# Patient Record
Sex: Male | Born: 1980 | Race: Black or African American | Hispanic: No | Marital: Single | Smoking: Current every day smoker
Health system: Southern US, Community
[De-identification: ages and names within clinical notes are randomized; demographics above are authoritative.]

## PROBLEM LIST (undated history)

## (undated) DIAGNOSIS — F1410 Cocaine abuse, uncomplicated: Secondary | ICD-10-CM

---

## 2015-06-19 ENCOUNTER — Encounter (HOSPITAL_BASED_OUTPATIENT_CLINIC_OR_DEPARTMENT_OTHER): Payer: Self-pay | Admitting: Emergency Medicine

## 2015-06-19 ENCOUNTER — Emergency Department (HOSPITAL_BASED_OUTPATIENT_CLINIC_OR_DEPARTMENT_OTHER)
Admission: EM | Admit: 2015-06-19 | Discharge: 2015-06-19 | Disposition: A | Discharge status: Home / Self Care | Payer: Self-pay | Attending: Emergency Medicine | Admitting: Emergency Medicine

## 2015-06-19 DIAGNOSIS — F1410 Cocaine abuse, uncomplicated: Secondary | ICD-10-CM | POA: Insufficient documentation

## 2015-06-19 DIAGNOSIS — F17200 Nicotine dependence, unspecified, uncomplicated: Secondary | ICD-10-CM | POA: Insufficient documentation

## 2015-06-19 HISTORY — DX: Cocaine abuse, uncomplicated: F14.10

## 2015-06-19 LAB — RAPID URINE DRUG SCREEN, HOSP PERFORMED
AMPHETAMINES: NOT DETECTED
BARBITURATES: NOT DETECTED
BENZODIAZEPINES: NOT DETECTED
COCAINE: POSITIVE — AB
OPIATES: NOT DETECTED
TETRAHYDROCANNABINOL: POSITIVE — AB

## 2015-06-19 NOTE — ED Notes (Signed)
Pt states has been doing a lot of cocaine, states having HA and nose pain. Nose very sensitive to touch, has been having bleeding from both nares. Also here for medical clearance for rehab admission. States has been snorting cocaine for 15 years. States does cocaine every day.

## 2015-06-19 NOTE — ED Provider Notes (Signed)
CSN: [id number]     Arrival date & time 06/19/15  1047 History   First MD Initiated Contact with Patient 06/19/15 1142     Chief Complaint  Patient presents with  . Medical Clearance     (Consider location/radiation/quality/duration/timing/severity/associated sxs/prior Treatment) HPI Comments: Patient is a 35 year old male with history of cocaine abuse. He presents for evaluation of sore nose and requesting rehabilitation. He reports an increased frequency of his cocaine use over the past 2-3 months and has come to the conclusion that this is causing problems for him. He is here requesting information about possible treatment centers.  The history is provided by the patient.    Past Medical History  Diagnosis Date  . Cocaine abuse    History reviewed. No pertinent past surgical history. History reviewed. No pertinent family history. Social History  Substance Use Topics  . Smoking status: Current Every Day Smoker  . Smokeless tobacco: None  . Alcohol Use: No    Review of Systems  All other systems reviewed and are negative.     Allergies  Review of patient's allergies indicates no known allergies.  Home Medications   Prior to Admission medications   Not on File   BP 146/98 mmHg  Pulse 86  Temp(Src) 98.2 F (36.8 C) (Oral)  Resp 16  Ht  (1.88 m)  Wt 310 lb (140.615 kg)  BMI 39.78 kg/m2  SpO2 97% Physical Exam  Constitutional: He is oriented to person, place, and time. He appears well-developed and well-nourished. No distress.  HENT:  Head: Normocephalic and atraumatic.  Neck: Normal range of motion. Neck supple.  Musculoskeletal: Normal range of motion.  Neurological: He is alert and oriented to person, place, and time.  Skin: Skin is warm and dry. He is not diaphoretic.  Nursing note and vitals reviewed.   ED Course  Procedures (including critical care time) Labs Review Labs Reviewed  URINE RAPID DRUG SCREEN, HOSP PERFORMED    Imaging  Review No results found. I have personally reviewed and evaluated these images and lab results as part of my medical decision-making.    MDM   Final diagnoses:  None    Patient will be given the substance abuse treatment resource guide. He otherwise appears medically stable.    Geoffery Lyons, MD 06/19/15 1155

## 2015-06-19 NOTE — ED Notes (Signed)
Pt requesting help with addiction to cocaine.  Pt wanting to go to rehab.  Pt also having nose pain.  Last use was two days ago.

## 2015-06-19 NOTE — Discharge Instructions (Signed)
Stimulant Use Disorder-Cocaine °Cocaine is one of a group of powerful drugs called stimulants. Cocaine has medical uses for stopping nosebleeds and for pain control before minor nose or dental surgery. However, cocaine is misused because of the effects that it produces. These effects include:  °· A feeling of extreme pleasure. °· Alertness. °· High energy. °Common street names for cocaine include coke, crack, blow, snow, and nose candy. Cocaine is snorted, dissolved in water and injected, or smoked.  °Stimulants are addictive because they activate regions of the brain that produce both the pleasurable sensation of "reward" and psychological dependence. Together, these actions account for loss of control and the rapid development of drug dependence. This means you become ill without the drug (withdrawal) and need to keep using it to function.  °Stimulant use disorder is use of stimulants that disrupts your daily life. It disrupts relationships with family and friends and how you do your job. Cocaine increases your blood pressure and heart rate. It can cause a heart attack or stroke. Cocaine can also cause death from irregular heart rate or seizures. °SYMPTOMS °Symptoms of stimulant use disorder with cocaine include: °· Use of cocaine in larger amounts or over a longer period of time than intended. °· Unsuccessful attempts to cut down or control cocaine use. °· A lot of time spent obtaining, using, or recovering from the effects of cocaine. °· A strong desire or urge to use cocaine (craving). °· Continued use of cocaine in spite of major problems at work, school, or home because of use. °· Continued use of cocaine in spite of relationship problems because of use. °· Giving up or cutting down on important life activities because of cocaine use. °· Use of cocaine over and over in situations when it is physically hazardous, such as driving a car. °· Continued use of cocaine in spite of a physical problem that is likely  related to use. Physical problems can include: °¨ Malnutrition. °¨ Nosebleeds. °¨ Chest pain. °¨ High blood pressure. °¨ A hole that develops between the part of your nose that separates your nostrils (perforated nasal septum). °¨ Lung and kidney damage. °· Continued use of cocaine in spite of a mental problem that is likely related to use. Mental problems can include: °¨ Schizophrenia-like symptoms. °¨ Depression. °¨ Bipolar mood swings. °¨ Anxiety. °¨ Sleep problems. °· Need to use more and more cocaine to get the same effect, or lessened effect over time with use of the same amount of cocaine (tolerance). °· Having withdrawal symptoms when cocaine use is stopped, or using cocaine to reduce or avoid withdrawal symptoms. Withdrawal symptoms include: °¨ Depressed or irritable mood. °¨ Low energy or restlessness. °¨ Bad dreams. °¨ Poor or excessive sleep. °¨ Increased appetite. °DIAGNOSIS °Stimulant use disorder is diagnosed by your health care provider. You may be asked questions about your cocaine use and how it affects your life. A physical exam may be done. A drug screen may be ordered. You may be referred to a mental health professional. The diagnosis of stimulant use disorder requires at least two symptoms within 12 months. The type of stimulant use disorder depends on the number of signs and symptoms you have. The type may be: °· Mild. Two or three signs and symptoms. °· Moderate. Four or five signs and symptoms. °· Severe. Six or more signs and symptoms. °TREATMENT °Treatment for stimulant use disorder is usually provided by mental health professionals with training in substance use disorders. The following options are available: °·   Counseling or talk therapy. Talk therapy addresses the reasons you use cocaine and ways to keep you from using again. Goals of talk therapy include:  Identifying and avoiding triggers for use.  Handling cravings.  Replacing use with healthy activities.  Support groups.  Support groups provide emotional support, advice, and guidance.  Medicine. Certain medicines may decrease cocaine cravings or withdrawal symptoms. HOME CARE INSTRUCTIONS  Take medicines only as directed by your health care provider.  Identify the people and activities that trigger your cocaine use and avoid them.  Keep all follow-up visits as directed by your health care provider. SEEK MEDICAL CARE IF:  Your symptoms get worse or you relapse.  You are not able to take medicines as directed. SEEK IMMEDIATE MEDICAL CARE IF:  You have serious thoughts about hurting yourself or others.  You have a seizure, chest pain, sudden weakness, or loss of speech or vision. FOR MORE INFORMATION  National Institute on Drug Abuse: http://www.price-smith.com/  Substance Abuse and Mental Health Services Administration: SkateOasis.com.pt   This information is not intended to replace advice given to you by your health care provider. Make sure you discuss any questions you have with your health care provider.   Document Released: 04/16/2000 Document Revised: 05/10/2014 Document Reviewed: 05/02/2013 Elsevier Interactive Patient Education 2016 [employer].    Emergency Department Resource Guide 1) Find a Doctor and Pay Out of Pocket Although you won't have to find out who is covered by your insurance plan, it is a good idea to ask around and get recommendations. You will then need to call the office and see if the doctor you have chosen will accept you as a new patient and what types of options they offer for patients who are self-pay. Some doctors offer discounts or will set up payment plans for their patients who do not have insurance, but you will need to ask so you aren't surprised when you get to your appointment.  2) Contact Your Local Health Department Not all health departments have doctors that can see patients for sick visits, but many do, so it is worth a call to see if yours does. If you don't know  where your local health department is, you can check in your phone book. The CDC also has a tool to help you locate your state's health department, and many state websites also have listings of all of their local health departments.  3) Find a Walk-in Clinic If your illness is not likely to be very severe or complicated, you may want to try a walk in clinic. These are popping up all over the country in pharmacies, drugstores, and shopping centers. They're usually staffed by nurse practitioners or physician assistants that have been trained to treat common illnesses and complaints. They're usually fairly quick and inexpensive. However, if you have serious medical issues or chronic medical problems, these are probably not your best option.  No Primary Care Doctor: - Call Health Connect at  [phone] - they can help you locate a primary care doctor that  accepts your insurance, provides certain services, etc. - Physician Referral Service- [phone]  Chronic Pain Problems: Organization         Address  Phone   Notes  [facility]  [phone] Patients need to be referred by their primary care doctor.   Medication Assistance: Organization         Address  Phone   Notes  [facility] Medication Assistance Program [address] [place], [place] [address] [phone] --Must be a resident of [facility] -- Must have NO insurance coverage whatsoever (no Medicaid/ Medicare, etc.) -- The pt. MUST have a primary care doctor that directs their care regularly and follows them in the community   [facility]  [phone]   [employer]  [phone]    Agencies that provide inexpensive medical care: Organization         Address  Phone   Notes  [facility]  [phone]   [facility]    [phone]   [facility] [address] [place], [place] [address] [phone]   [facility]  [address] [place] [phone]   Planned Parenthood    [phone]   [facility]    [phone]   [facility]  [address], [place] Phone:  [phone], Fax:  [phone] Hours of Operation:  9 am - 6 pm, M-F.  Also accepts Medicaid/Medicare and self-pay.  [facility]  [address], [place] Phone: [phone], Fax: [phone]. Hours of Operation:  8:30 am - 5:30 pm, M-F.  Also accepts Medicaid and self-pay.  [facility] [place] [address] [place] Phone: [phone]   [facility] [address] Natasha Bence [place], [place] [phone], Ext. [phone] Mondays & Thursdays: 7-9 AM.  First 15 patients are seen on a first come, first serve basis.    Medicaid-accepting [facility] Providers:  Organization         Address  Phone   Notes  [facility] [address], [place] [phone] Also accepts self-pay patients.  [facility] [address] Laurell Josephs [place], [place]  [phone]   [facility] [address], [place] [phone]   [facility] [address] [place] [phone]   Renaye Rakers [address], [place]   [phone] Only accepts [place] Access [place] patients after they have their name applied to their card.   Self-Pay (no insurance) in [facility]:  Organization         Address  Phone   Notes  Sickle Cell Patients, [facility] [address] [place], [place] [phone]   [facility] [address] [place], [place] [phone]   [facility]  [address], [place] [phone]   [facility]/Dr. Osei-Bonsu  [address] [place] or [address], [place] [phone] Phone number for both  [place] and [place] locations is the same.  [facility] [address] [place] [phone]   [facility] [address], [place] or [address] [phone]   [facility] [address] [place] [phone], phone; [phone], fax Sees patients 1st and 3rd Saturday of every month.  Must not qualify for public or private insurance (i.e. Medicaid, Medicare, [place] Health Choice, Veterans' Benefits)  Household income should be no more than 200% of the poverty level The clinic cannot treat you if you are pregnant or think you are pregnant  Sexually transmitted diseases are not treated at the clinic.  Dental Care: Organization         Address  Phone  Notes  [facility] Department of [facility] [address] [place], [place] [phone] Accepts children up to age 69 who are enrolled in [place] or [place] Health Choice; pregnant women with a Medicaid card; and children who have applied for Medicaid or [place] Health Choice, but were declined, whose parents can pay a reduced fee at time of service.  [facility] Department of [facility]  [address], [place] [phone] Accepts children up to age 7 who are enrolled in [place] or [place] Health Choice; pregnant women with a Medicaid card; and children who have applied for Medicaid or [place] Health Choice, but were declined, whose parents can pay a reduced fee at time of service.  [facility]  [address] [place], [place] [phone] Patients are seen by appointment only. Walk-ins are not accepted. [facility] will see patients 12 years of age and older. Monday - Tuesday (8am-5pm) Most Wednesdays (8:30-5pm) $30 per visit, cash only  [facility] Adult Dental Access PROGRAM  [address], [facility] [phone] Patients are seen by appointment only. Walk-ins are not  accepted. [facility] will see patients 62 years of age and older. One Wednesday Evening (Monthly: Volunteer Based).  $30 per visit, cash only  [employer]  [phone] for adults; Children under age 2, call Graduate Pediatric Dentistry at [phone]. Children aged 73-14, please call [phone] to request a pediatric application.  Dental services are provided in all areas of dental care including fillings, crowns and bridges, complete and partial dentures, implants, gum treatment, root canals, and extractions. Preventive care is also provided. Treatment is provided to both adults and children. Patients are selected via a lottery and there is often a waiting list.   [facility] [address] [place]  [phone] [email or web address]   [facility] [address] [place], [place] [phone], Ext. [phone] Second and Fourth Thursday of each month, opens at 6:30 AM; Clinic ends at 9 AM.  Patients are seen on a first-come first-served basis, and a limited number are seen during each clinic.   [facility]  [address] Ether Griffins [place], [place] [phone]   Eligibility Requirements You must have lived in [place], [place], or [place] counties for at least the last three months.   You cannot be eligible for state or federal sponsored [place], including [employer], [place], or [employer].   You generally cannot be eligible for healthcare insurance through your employer.    How to apply: Eligibility screenings are held every Tuesday and Wednesday afternoon from 1:00 pm until 4:00 pm. You do not need an appointment for the interview!  [facility] [address] [place], [place] [phone]   [facility] Health Department  [phone]   [facility] Health Department  [phone]   [facility] Health Department  [phone]    Behavioral Health Resources in the  Community: Intensive Outpatient Programs Organization         Address  Phone  Notes  [facility] Services [address], [place], [place] [phone]   [facility] Outpatient [address] [place], [place] [phone]   [facility] [address], [place], [place]  [phone]   [facility] Mental Health [address],  [place], [place] [phone] or [phone]   Substance Abuse Resources Organization         Address  Phone  Notes  [facility]  [phone]   [facility]  [phone]   [facility]  [phone]   Floydene Flock  [phone]   Residential & Outpatient Substance Abuse Program  [phone]   Psychological Services Organization         Address  Phone  Notes  [facility] Behavioral Health  [phone]   [facility] Services  [phone]   [facility] Mental [facility] [address] [place] [phone] or [phone]    Mobile Crisis Teams Organization         Address  Phone  Notes  [facility], Mobile Crisis Care Unit  [phone]   [facility]  [address] [place], [place] [phone]   Doristine Locks [address] [place] [phone]    Self-Help/Support Groups Organization         Address  Phone             Notes  [facility] [place] - variety of support groups  336- I7437963 Call for more information  Narcotics Anonymous (NA), [facility] [address], [employer] [place]  2 meetings at this location   Statistician         Address  Phone  Notes  [facility] [address],    [place]  [phone]   [facility]  [address] [place] [address], [place], [place] [phone]   [facility] Treatment Facility [address] [place], [place] [phone] Admissions: 8am-3pm M-F  [facility] [address]    [place], [place] [phone]   [facility] [address] [place], [place], [place] [phone]   [facility] [address],  [place], [place] [phone]   [facility] - Intensive Outpatient [address] Laurell Josephs [address], [place], [place] [phone]   [facility] ([facility] [address] [place]  [place], [place] [phone] or [phone]   [facility] ([facility]) [address] [place], [place] [phone] Accepts Medicaid  [facility] [address]  [place] [phone] Substance Abuse/Addiction Treatment   [facility] Organization         Address  Phone  Notes  [facility]  6025951940   Angie Fava, PhD [address] Ervin Knack [place], [place]   [phone] or ([phone]   [facility] Behavioral   [address] [place], [place] [phone]   [facility] [address] [place], [place] [phone] Insurance/Medicaid/sponsorship through [facility] and Families [address]                                    [place], [place] [phone] Therapy/tele-psych/case  [facility] [address], [place] [phone]    Dr. Lolly Mustache  [phone]   [facility]  [employer] [facility] Dept. 1) [address], [place] 2) [address], [place] 3)  [address], [place] [phone]  [phone]   [facility] Child Abuse Hotline 828-091-0037 or [phone] (After Hours)

## 2017-04-25 ENCOUNTER — Emergency Department (HOSPITAL_BASED_OUTPATIENT_CLINIC_OR_DEPARTMENT_OTHER): Payer: Self-pay

## 2017-04-25 ENCOUNTER — Encounter (HOSPITAL_BASED_OUTPATIENT_CLINIC_OR_DEPARTMENT_OTHER): Payer: Self-pay | Admitting: Adult Health

## 2017-04-25 ENCOUNTER — Other Ambulatory Visit: Payer: Self-pay

## 2017-04-25 ENCOUNTER — Emergency Department (HOSPITAL_BASED_OUTPATIENT_CLINIC_OR_DEPARTMENT_OTHER)
Admission: EM | Admit: 2017-04-25 | Discharge: 2017-04-25 | Disposition: A | Discharge status: Home / Self Care | Payer: Self-pay | Attending: Emergency Medicine | Admitting: Emergency Medicine

## 2017-04-25 DIAGNOSIS — F17210 Nicotine dependence, cigarettes, uncomplicated: Secondary | ICD-10-CM | POA: Insufficient documentation

## 2017-04-25 DIAGNOSIS — J189 Pneumonia, unspecified organism: Secondary | ICD-10-CM | POA: Insufficient documentation

## 2017-04-25 LAB — COMPREHENSIVE METABOLIC PANEL
ALBUMIN: 3.7 g/dL (ref 3.5–5.0)
ALT: 15 U/L — ABNORMAL LOW (ref 17–63)
AST: 30 U/L (ref 15–41)
Alkaline Phosphatase: 67 U/L (ref 38–126)
Anion gap: 13 (ref 5–15)
BILIRUBIN TOTAL: 0.7 mg/dL (ref 0.3–1.2)
BUN: 17 mg/dL (ref 6–20)
CHLORIDE: 95 mmol/L — AB (ref 101–111)
CO2: 28 mmol/L (ref 22–32)
Calcium: 8.7 mg/dL — ABNORMAL LOW (ref 8.9–10.3)
Creatinine, Ser: 1.57 mg/dL — ABNORMAL HIGH (ref 0.61–1.24)
GFR calc Af Amer: >60 mL/min (ref >60)
GFR calc non Af Amer: 55 mL/min — ABNORMAL LOW (ref >60)
GLUCOSE: 135 mg/dL — AB (ref 65–99)
POTASSIUM: 3.3 mmol/L — AB (ref 3.5–5.1)
Sodium: 136 mmol/L (ref 135–145)
TOTAL PROTEIN: 8.7 g/dL — AB (ref 6.5–8.1)

## 2017-04-25 LAB — CBC WITH DIFFERENTIAL/PLATELET
Basophils Absolute: 0 10*3/uL (ref 0.0–0.1)
Basophils Relative: 0 %
EOS PCT: 0 %
Eosinophils Absolute: 0 10*3/uL (ref 0.0–0.7)
HCT: 49.6 % (ref 39.0–52.0)
Hemoglobin: 16.5 g/dL (ref 13.0–17.0)
LYMPHS ABS: 1.4 10*3/uL (ref 0.7–4.0)
LYMPHS PCT: 7 %
MCH: 28.7 pg (ref 26.0–34.0)
MCHC: 33.3 g/dL (ref 30.0–36.0)
MCV: 86.3 fL (ref 78.0–100.0)
MONOS PCT: 6 %
Monocytes Absolute: 1.1 10*3/uL — ABNORMAL HIGH (ref 0.1–1.0)
Neutro Abs: 16.2 10*3/uL — ABNORMAL HIGH (ref 1.7–7.7)
Neutrophils Relative %: 87 %
PLATELETS: 161 10*3/uL (ref 150–400)
RBC: 5.75 MIL/uL (ref 4.22–5.81)
RDW: 14.3 % (ref 11.5–15.5)
WBC: 18.7 10*3/uL — ABNORMAL HIGH (ref 4.0–10.5)

## 2017-04-25 MED ORDER — KETOROLAC TROMETHAMINE 30 MG/ML IJ SOLN
30.0000 mg | Freq: Once | INTRAMUSCULAR | Status: AC
  Administered 2017-04-25: 30 mg via INTRAVENOUS

## 2017-04-25 MED ORDER — ALBUTEROL SULFATE HFA 108 (90 BASE) MCG/ACT IN AERS
2.0000 | INHALATION_SPRAY | Freq: Once | RESPIRATORY_TRACT | Status: AC
  Administered 2017-04-25: 2 via RESPIRATORY_TRACT
  Filled 2017-04-25: qty 6.7

## 2017-04-25 MED ORDER — SODIUM CHLORIDE 0.9 % IV BOLUS (SEPSIS)
1000.0000 mL | Freq: Once | INTRAVENOUS | Status: AC
  Administered 2017-04-25: 1000 mL via INTRAVENOUS

## 2017-04-25 MED ORDER — IBUPROFEN 800 MG PO TABS: 800.0000 mg | ORAL_TABLET | Freq: Three times a day (TID) | ORAL | 0 refills | Status: AC | PRN

## 2017-04-25 MED ORDER — DEXTROSE 5 % IV SOLN
1.0000 g | Freq: Once | INTRAVENOUS | Status: AC
  Administered 2017-04-25: 1 g via INTRAVENOUS
  Filled 2017-04-25: qty 10

## 2017-04-25 MED ORDER — METOCLOPRAMIDE HCL 10 MG PO TABS
10.0000 mg | ORAL_TABLET | Freq: Once | ORAL | Status: AC
  Administered 2017-04-25: 10 mg via ORAL
  Filled 2017-04-25: qty 1

## 2017-04-25 MED ORDER — KETOROLAC TROMETHAMINE 30 MG/ML IJ SOLN
30.0000 mg | Freq: Once | INTRAMUSCULAR | Status: DC
  Filled 2017-04-25: qty 1

## 2017-04-25 MED ORDER — DOXYCYCLINE HYCLATE 100 MG PO CAPS: 100.0000 mg | ORAL_CAPSULE | Freq: Two times a day (BID) | ORAL | 0 refills | Status: AC

## 2017-04-25 NOTE — ED Provider Notes (Signed)
Emergency Department Provider Note   I have reviewed the triage vital signs and the nursing notes.   HISTORY  Chief Complaint Fever   HPI Desmond LopeShawn Galen is a 36 y.o. male stents to the emergency department for evaluation of chills, subjective fevers, sweating, shortness of breath with nasal congestion and headache.  States his runny nose began 2 days ago.  He has been fatigued aching pain in his muscles including his back, arms, legs.  Had some diffuse abdominal discomfort but denies any nausea, vomiting, or diarrhea.  No sick contacts. No radiation of symptoms. No modifying factors.   Past Medical History:  Diagnosis Date  . Cocaine abuse (HCC)     There are no active problems to display for this patient.   History reviewed. No pertinent surgical history.    Allergies Patient has no known allergies.  History reviewed. No pertinent family history.  Social History Social History   Tobacco Use  . Smoking status: Current Every Day Smoker  Substance Use Topics  . Alcohol use: No  . Drug use: Yes    Types: Cocaine    Review of Systems  Constitutional: Positive subjective fever/chills Eyes: No visual changes. ENT: No sore throat. Positive runny nose.  Cardiovascular: Denies chest pain. Respiratory: Positive shortness of breath and cough.  Gastrointestinal: No abdominal pain. No nausea, no vomiting.  No diarrhea.  No constipation. Genitourinary: Negative for dysuria. Musculoskeletal: Negative for back pain. Positive muscle aches.  Skin: Negative for rash. Neurological: Negative for focal weakness or numbness. Positive HA.   10-point ROS otherwise negative.  ____________________________________________   PHYSICAL EXAM:  VITAL SIGNS: ED Triage Vitals  Enc Vitals Group     BP 04/25/17 1941 (!) 163/109     Pulse Rate 04/25/17 1941 (!) 110     Resp 04/25/17 1941 (!) 22     Temp 04/25/17 1941 98.8 F (37.1 C)     Temp Source 04/25/17 1941 Oral     SpO2  04/25/17 1941 99 %     Weight 04/25/17 1942 263 lb (119.3 kg)     Height 04/25/17 1942 6\' 3"  (1.905 m)     Pain Score 04/25/17 1941 9   Constitutional: Alert and oriented. Well appearing and in no acute distress. Eyes: Conjunctivae are normal. Head: Atraumatic. Nose: No congestion/rhinnorhea. Mouth/Throat: Mucous membranes are moist.  Neck: No stridor.  Cardiovascular: Tachycardia. Good peripheral circulation. Grossly normal heart sounds.   Respiratory: Normal respiratory effort.  No retractions. Lungs CTAB. Gastrointestinal: Soft and nontender. No distention.  Musculoskeletal: No lower extremity tenderness nor edema. No gross deformities of extremities. Neurologic:  Normal speech and language. No gross focal neurologic deficits are appreciated.  Skin:  Skin is warm, dry and intact. No rash noted.  ____________________________________________   LABS (all labs ordered are listed, but only abnormal results are displayed)  Labs Reviewed  COMPREHENSIVE METABOLIC PANEL - Abnormal; Notable for the following components:      Result Value   Potassium 3.3 (*)    Chloride 95 (*)    Glucose, Bld 135 (*)    Creatinine, Ser 1.57 (*)    Calcium 8.7 (*)    Total Protein 8.7 (*)    ALT 15 (*)    GFR calc non Af Amer 55 (*)    All other components within normal limits  CBC WITH DIFFERENTIAL/PLATELET - Abnormal; Notable for the following components:   WBC 18.7 (*)    Neutro Abs 16.2 (*)    Monocytes Absolute  1.1 (*)    All other components within normal limits   ____________________________________________  RADIOLOGY  Dg Chest 2 View  Result Date: 04/25/2017 CLINICAL DATA:  36 year old male with cough. EXAM: CHEST  2 VIEW COMPARISON:  None. FINDINGS: There is a focal area of consolidation in the right lower lobe most consistent with pneumonia. Clinical correlation and follow-up to resolution recommended. The left lung is clear. No pleural effusion or pneumothorax. The cardiac silhouette  is within normal limits. No acute osseous pathology. IMPRESSION: Right lower lobe pneumonia. Electronically Signed   By: Elgie CollardArash  Radparvar M.D.   On: 04/25/2017 20:07    ____________________________________________   PROCEDURES  Procedure(s) performed:   Procedures  None ____________________________________________   INITIAL IMPRESSION / ASSESSMENT AND PLAN / ED COURSE  Pertinent labs & imaging results that were available during my care of the patient were reviewed by me and considered in my medical decision making (see chart for details).  Patient presents to the emergency department for evaluation of flulike symptoms that began 2+ days ago.  She has a mild tachycardia on arrival.  Lungs are clear to auscultation bilaterally.  He does smoke cigarettes and I counseled him on smoking cessation.  Plan for albuterol inhaler Toradol, Reglan, chest x-ray to rule out pneumonia.  Discussed possibility of flu.  I do not have a rapid flu test available and he is close to if not out of the window to benefit from Tamiflu.  I discussed this with him in detail.   Patient with PNA on CXR. Rocephin given. No hypoxemia. Plan for abx and supportive care.   At this time, I do not feel there is any life-threatening condition present. I have reviewed and discussed all results (EKG, imaging, lab, urine as appropriate), exam findings with patient. I have reviewed nursing notes and appropriate previous records.  I feel the patient is safe to be discharged home without further emergent workup. Discussed usual and customary return precautions. Patient and family (if present) verbalize understanding and are comfortable with this plan.  Patient will follow-up with their primary care provider. If they do not have a primary care provider, information for follow-up has been provided to them. All questions have been answered.  ____________________________________________  FINAL CLINICAL IMPRESSION(S) / ED  DIAGNOSES  Final diagnoses:  Community acquired pneumonia of right lung, unspecified part of lung     MEDICATIONS GIVEN DURING THIS VISIT:  Medications  metoCLOPramide (REGLAN) tablet 10 mg (10 mg Oral Given 04/25/17 2022)  albuterol (PROVENTIL HFA;VENTOLIN HFA) 108 (90 Base) MCG/ACT inhaler 2 puff (2 puffs Inhalation Given 04/25/17 2002)  sodium chloride 0.9 % bolus 1,000 mL (0 mLs Intravenous Stopped 04/25/17 2140)  ketorolac (TORADOL) 30 MG/ML injection 30 mg (30 mg Intravenous Given 04/25/17 2037)  cefTRIAXone (ROCEPHIN) 1 g in dextrose 5 % 50 mL IVPB (0 g Intravenous Stopped 04/25/17 2140)     NEW OUTPATIENT MEDICATIONS STARTED DURING THIS VISIT:  Doxycycline and Motrin   Note:  This document was prepared using Dragon voice recognition software and may include unintentional dictation errors.  Alona BeneJoshua Long, MD Emergency Medicine    Long, Arlyss RepressJoshua G, MD 04/25/17 2308

## 2017-04-25 NOTE — ED Triage Notes (Addendum)
PResent with chills, subjective fevers, sweating, SOB and upper airway congestion as well as headache, runny nose that began 2 days ago. Endorses fatigue. Using Ibuprofen and tylemol at home. Denies vomiting, nausea and diarrhea

## 2017-04-25 NOTE — Discharge Instructions (Signed)
We believe that your symptoms are caused today by pneumonia, an infection in your lung(s).  Fortunately you should start to improve quickly after taking your antibiotics.  Please take the full course of antibiotics as prescribed and drink plenty of fluids.   ° °Follow up with your doctor within 1-2 days.  If you develop any new or worsening symptoms, including but not limited to fever in spite of taking over-the-counter ibuprofen and/or Tylenol, persistent vomiting, worsening shortness of breath, or other symptoms that concern you, please return to the Emergency Department immediately.  ° ° °Pneumonia °Pneumonia is an infection of the lungs.  °CAUSES °Pneumonia may be caused by bacteria or a virus. Usually, these infections are caused by breathing infectious particles into the lungs (respiratory tract). °SIGNS AND SYMPTOMS  °Cough. °Fever. °Chest pain. °Increased rate of breathing. °Wheezing. °Mucus production. °DIAGNOSIS  °If you have the common symptoms of pneumonia, your health care provider will typically confirm the diagnosis with a chest X-ray. The X-ray will show an abnormality in the lung (pulmonary infiltrate) if you have pneumonia. Other tests of your blood, urine, or sputum may be done to find the specific cause of your pneumonia. Your health care provider may also do tests (blood gases or pulse oximetry) to see how well your lungs are working. °TREATMENT  °Some forms of pneumonia may be spread to other people when you cough or sneeze. You may be asked to wear a mask before and during your exam. Pneumonia that is caused by bacteria is treated with antibiotic medicine. Pneumonia that is caused by the influenza virus may be treated with an antiviral medicine. Most other viral infections must run their course. These infections will not respond to antibiotics.  °HOME CARE INSTRUCTIONS  °Cough suppressants may be used if you are losing too much rest. However, coughing protects you by clearing your lungs. You  should avoid using cough suppressants if you can. °Your health care provider may have prescribed medicine if he or she thinks your pneumonia is caused by bacteria or influenza. Finish your medicine even if you start to feel better. °Your health care provider may also prescribe an expectorant. This loosens the mucus to be coughed up. °Take medicines only as directed by your health care provider. °Do not smoke. Smoking is a common cause of bronchitis and can contribute to pneumonia. If you are a smoker and continue to smoke, your cough may last several weeks after your pneumonia has cleared. °A cold steam vaporizer or humidifier in your room or home may help loosen mucus. °Coughing is often worse at night. Sleeping in a semi-upright position in a recliner or using a couple pillows under your head will help with this. °Get rest as you feel it is needed. Your body will usually let you know when you need to rest. °PREVENTION °A pneumococcal shot (vaccine) is available to prevent a common bacterial cause of pneumonia. This is usually suggested for: °People over 65 years old. °Patients on chemotherapy. °People with chronic lung problems, such as bronchitis or emphysema. °People with immune system problems. °If you are over 65 or have a high risk condition, you may receive the pneumococcal vaccine if you have not received it before. In some countries, a routine influenza vaccine is also recommended. This vaccine can help prevent some cases of pneumonia. You may be offered the influenza vaccine as part of your care. °If you smoke, it is time to quit. You may receive instructions on how to stop smoking. Your   health care provider can provide medicines and counseling to help you quit. °SEEK MEDICAL CARE IF: °You have a fever. °SEEK IMMEDIATE MEDICAL CARE IF:  °Your illness becomes worse. This is especially true if you are elderly or weakened from any other disease. °You cannot control your cough with suppressants and are losing  sleep. °You begin coughing up blood. °You develop pain which is getting worse or is uncontrolled with medicines. °Any of the symptoms which initially brought you in for treatment are getting worse rather than better. °You develop shortness of breath or chest pain. °MAKE SURE YOU:  °Understand these instructions. °Will watch your condition. °Will get help right away if you are not doing well or get worse. °Document Released: 04/19/2005 Document Revised: 09/03/2013 Document Reviewed: 07/09/2010 °[employer]® Patient Information ©2015 [employer]. This information is not intended to replace advice given to you by your health care provider. Make sure you discuss any questions you have with your health care provider. ° ° ° °

## 2020-10-20 ENCOUNTER — Other Ambulatory Visit: Payer: Self-pay

## 2020-10-20 ENCOUNTER — Encounter (HOSPITAL_BASED_OUTPATIENT_CLINIC_OR_DEPARTMENT_OTHER): Payer: Self-pay

## 2020-10-20 ENCOUNTER — Emergency Department (HOSPITAL_BASED_OUTPATIENT_CLINIC_OR_DEPARTMENT_OTHER)
Admission: EM | Admit: 2020-10-20 | Discharge: 2020-10-20 | Disposition: A | Discharge status: Home / Self Care | Payer: Self-pay | Attending: Emergency Medicine | Admitting: Emergency Medicine

## 2020-10-20 DIAGNOSIS — S199XXA Unspecified injury of neck, initial encounter: Secondary | ICD-10-CM | POA: Insufficient documentation

## 2020-10-20 DIAGNOSIS — M542 Cervicalgia: Secondary | ICD-10-CM

## 2020-10-20 DIAGNOSIS — Y92410 Unspecified street and highway as the place of occurrence of the external cause: Secondary | ICD-10-CM | POA: Insufficient documentation

## 2020-10-20 DIAGNOSIS — S0990XA Unspecified injury of head, initial encounter: Secondary | ICD-10-CM | POA: Insufficient documentation

## 2020-10-20 DIAGNOSIS — F17290 Nicotine dependence, other tobacco product, uncomplicated: Secondary | ICD-10-CM | POA: Insufficient documentation

## 2020-10-20 NOTE — Discharge Instructions (Addendum)
Ibuprofen for soreness  
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
## 2020-11-25 ENCOUNTER — Emergency Department (HOSPITAL_BASED_OUTPATIENT_CLINIC_OR_DEPARTMENT_OTHER)
Admission: EM | Admit: 2020-11-25 | Discharge: 2020-11-26 | Disposition: A | Discharge status: Home / Self Care | Payer: Self-pay | Attending: Emergency Medicine | Admitting: Emergency Medicine

## 2020-11-25 ENCOUNTER — Encounter (HOSPITAL_BASED_OUTPATIENT_CLINIC_OR_DEPARTMENT_OTHER): Payer: Self-pay

## 2020-11-25 ENCOUNTER — Emergency Department (HOSPITAL_BASED_OUTPATIENT_CLINIC_OR_DEPARTMENT_OTHER): Payer: Self-pay

## 2020-11-25 ENCOUNTER — Other Ambulatory Visit: Payer: Self-pay

## 2020-11-25 DIAGNOSIS — R0981 Nasal congestion: Secondary | ICD-10-CM | POA: Insufficient documentation

## 2020-11-25 DIAGNOSIS — F17290 Nicotine dependence, other tobacco product, uncomplicated: Secondary | ICD-10-CM | POA: Insufficient documentation

## 2020-11-25 DIAGNOSIS — R5383 Other fatigue: Secondary | ICD-10-CM | POA: Insufficient documentation

## 2020-11-25 DIAGNOSIS — R350 Frequency of micturition: Secondary | ICD-10-CM | POA: Insufficient documentation

## 2020-11-25 DIAGNOSIS — Z20822 Contact with and (suspected) exposure to covid-19: Secondary | ICD-10-CM | POA: Insufficient documentation

## 2020-11-25 DIAGNOSIS — R0789 Other chest pain: Secondary | ICD-10-CM

## 2020-11-25 DIAGNOSIS — R059 Cough, unspecified: Secondary | ICD-10-CM | POA: Insufficient documentation

## 2020-11-25 DIAGNOSIS — R072 Precordial pain: Secondary | ICD-10-CM | POA: Insufficient documentation

## 2020-11-25 DIAGNOSIS — R739 Hyperglycemia, unspecified: Secondary | ICD-10-CM

## 2020-11-25 LAB — BASIC METABOLIC PANEL
Anion gap: 13 (ref 5–15)
BUN: 23 mg/dL — ABNORMAL HIGH (ref 6–20)
CO2: 24 mmol/L (ref 22–32)
Calcium: 9.6 mg/dL (ref 8.9–10.3)
Chloride: 91 mmol/L — ABNORMAL LOW (ref 98–111)
Creatinine, Ser: 1.68 mg/dL — ABNORMAL HIGH (ref 0.61–1.24)
GFR, Estimated: 52 mL/min — ABNORMAL LOW (ref >60)
Glucose, Bld: 476 mg/dL — ABNORMAL HIGH (ref 70–99)
Potassium: 4.6 mmol/L (ref 3.5–5.1)
Sodium: 128 mmol/L — ABNORMAL LOW (ref 135–145)

## 2020-11-25 LAB — CBC
HCT: 48.5 % (ref 39.0–52.0)
Hemoglobin: 16.7 g/dL (ref 13.0–17.0)
MCH: 28.9 pg (ref 26.0–34.0)
MCHC: 34.4 g/dL (ref 30.0–36.0)
MCV: 83.9 fL (ref 80.0–100.0)
Platelets: 222 10*3/uL (ref 150–400)
RBC: 5.78 MIL/uL (ref 4.22–5.81)
RDW: 13.5 % (ref 11.5–15.5)
WBC: 10.6 10*3/uL — ABNORMAL HIGH (ref 4.0–10.5)
nRBC: 0 % (ref 0.0–0.2)

## 2020-11-25 LAB — TROPONIN I (HIGH SENSITIVITY): Troponin I (High Sensitivity): 4 ng/L (ref <18)

## 2020-11-25 MED ORDER — SODIUM CHLORIDE 0.9 % IV BOLUS
1000.0000 mL | Freq: Once | INTRAVENOUS | Status: AC
  Administered 2020-11-26: 1000 mL via INTRAVENOUS

## 2020-11-25 MED ORDER — INSULIN ASPART 100 UNIT/ML IJ SOLN
8.0000 [IU] | Freq: Once | INTRAMUSCULAR | Status: AC
  Administered 2020-11-26: 8 [IU] via INTRAVENOUS

## 2020-11-25 NOTE — ED Triage Notes (Signed)
Reports chest pain.  Reports he was told his heart beats slow. Now he feels his heart is beating too fast.  Patient was incarcerated and had similar symptoms.  Has been out for 34 days. Also complains of cough.

## 2020-11-25 NOTE — ED Provider Notes (Signed)
[facility] EMERGENCY DEPARTMENT Provider Note   CSN: [id number] Arrival date & time: 11/25/20  2133     History Chief Complaint  Patient presents with   Chest Pain    Richard Mata is a 40 y.o. male.  Patient is a 40 year old male with no significant past medical history.  Patient presenting today with complaints of chest congestion, cough, fatigue, frequent urination, and increased thirst.  This has been worsening over the past several days.  He denies any fevers or chills.  He denies any ill contacts.  He tells me he did take a COVID test at home which was negative.  The history is provided by the patient.  Chest Pain Pain location:  Substernal area Pain quality: pressure   Pain radiates to:  Does not radiate Pain severity:  Moderate Onset quality:  Gradual Duration:  3 days Timing:  Intermittent Progression:  Worsening Chronicity:  New     Past Medical History:  Diagnosis Date   Cocaine abuse (HCC)     There are no problems to display for this patient.   History reviewed. No pertinent surgical history.     No family history on file.  Social History   Tobacco Use   Smoking status: Every Day    Types: Cigars   Smokeless tobacco: Never  Vaping Use   Vaping Use: Never used  Substance Use Topics   Alcohol use: No   Drug use: Yes    Types: Cocaine    Home Medications Prior to Admission medications   Medication Sig Start Date End Date Taking? Authorizing Provider  doxycycline (VIBRAMYCIN) 100 MG capsule Take 1 capsule (100 mg total) by mouth 2 (two) times daily. 04/25/17   Long, Arlyss Repress, MD  ibuprofen (ADVIL,MOTRIN) 800 MG tablet Take 1 tablet (800 mg total) by mouth every 8 (eight) hours as needed. 04/25/17   Long, Arlyss Repress, MD    Allergies    Patient has no known allergies.  Review of Systems   Review of Systems  Cardiovascular:  Positive for chest pain.  All other systems reviewed and are negative.  Physical Exam Updated Vital  Signs BP 123/88   Pulse 90   Temp 98.8 F (37.1 C) (Oral)   Resp 16   Ht 6\' 2"  (1.88 m)   Wt (!) 140.2 kg   SpO2 96%   BMI 39.67 kg/m   Physical Exam Vitals and nursing note reviewed.  Constitutional:      General: He is not in acute distress.    Appearance: He is well-developed. He is not diaphoretic.  HENT:     Head: Normocephalic and atraumatic.  Cardiovascular:     Rate and Rhythm: Normal rate and regular rhythm.     Heart sounds: No murmur heard.   No friction rub.  Pulmonary:     Effort: Pulmonary effort is normal. No respiratory distress.     Breath sounds: Normal breath sounds. No wheezing or rales.  Abdominal:     General: Bowel sounds are normal. There is no distension.     Palpations: Abdomen is soft.     Tenderness: There is no abdominal tenderness.  Musculoskeletal:        General: Normal range of motion.     Cervical back: Normal range of motion and neck supple.  Skin:    General: Skin is warm and dry.  Neurological:     Mental Status: He is alert and oriented to person, place, and time.  Coordination: Coordination normal.    ED Results / Procedures / Treatments   Labs (all labs ordered are listed, but only abnormal results are displayed) Labs Reviewed  BASIC METABOLIC PANEL - Abnormal; Notable for the following components:      Result Value   Sodium 128 (*)    Chloride 91 (*)    Glucose, Bld 476 (*)    BUN 23 (*)    Creatinine, Ser 1.68 (*)    GFR, Estimated 52 (*)    All other components within normal limits  CBC - Abnormal; Notable for the following components:   WBC 10.6 (*)    All other components within normal limits  RESP PANEL BY RT-PCR (FLU A&B, COVID) [id number]  TROPONIN I (HIGH SENSITIVITY)  TROPONIN I (HIGH SENSITIVITY)    EKG EKG Interpretation  Date/Time:  Tuesday November 25 2020 21:47:31 EDT Ventricular Rate:  112 PR Interval:  140 QRS Duration: 86 QT Interval:  340 QTC Calculation: 464 R Axis:   128 Text  Interpretation: Sinus tachycardia Right axis deviation No old tracing for comparison Confirmed by Alona Bene [phone]) on 11/25/2020 11:18:52 PM  Radiology DG Chest 2 View  Result Date: 11/25/2020 CLINICAL DATA:  Chest pain. EXAM: CHEST - 2 VIEW COMPARISON:  Sep 15, 2018 FINDINGS: The heart size and mediastinal contours are within normal limits. Both lungs are clear. The visualized skeletal structures are unremarkable. IMPRESSION: No active cardiopulmonary disease. Electronically Signed   By: Aram Candela M.D.   On: 11/25/2020 23:10    Procedures Procedures   Medications Ordered in ED Medications  sodium chloride 0.9 % bolus 1,000 mL (has no administration in time range)  insulin aspart (novoLOG) injection 8 Units (has no administration in time range)    ED Course  I have reviewed the triage vital signs and the nursing notes.  Pertinent labs & imaging results that were available during my care of the patient were reviewed by me and considered in my medical decision making (see chart for details).    MDM Rules/Calculators/A&P  Patient presenting here with complaints of cough, fatigue, and increased thirst and urination over the past several days.  Laboratory studies obtained showing glucose of nearly 500.  Patient has no prior history of diabetes.  He was given IV fluids along with IV insulin and sugars have improved significantly.  Remainder of his work-up is unremarkable.  COVID test is negative.  At this point, patient seems appropriate for discharge.  I will prescribe metformin and glucometer, and have him keep a record of his sugars at home.  Patient has no primary doctor and I have advised him to obtain a PCP.  Final Clinical Impression(s) / ED Diagnoses Final diagnoses:  None    Rx / DC Orders ED Discharge Orders     None        Geoffery Lyons, MD 11/26/20 [phone]

## 2020-11-26 LAB — TROPONIN I (HIGH SENSITIVITY): Troponin I (High Sensitivity): 4 ng/L (ref <18)

## 2020-11-26 LAB — RESP PANEL BY RT-PCR (FLU A&B, COVID) ARPGX2
Influenza A by PCR: NEGATIVE
Influenza B by PCR: NEGATIVE
SARS Coronavirus 2 by RT PCR: NEGATIVE

## 2020-11-26 LAB — CBG MONITORING, ED: Glucose-Capillary: 317 mg/dL — ABNORMAL HIGH (ref 70–99)

## 2020-11-26 MED ORDER — BENZONATATE 100 MG PO CAPS: 100.0000 mg | ORAL_CAPSULE | Freq: Three times a day (TID) | ORAL | 0 refills | Status: AC

## 2020-11-26 MED ORDER — BLOOD GLUCOSE MONITOR KIT: PACK | 0 refills | Status: AC

## 2020-11-26 MED ORDER — METFORMIN HCL 500 MG PO TABS: 500.0000 mg | ORAL_TABLET | Freq: Two times a day (BID) | ORAL | 0 refills | Status: AC

## 2020-11-26 NOTE — Discharge Instructions (Addendum)
Begin taking metformin as prescribed.  Begin taking Tessalon as prescribed as needed for cough.  Keep a record of your blood sugars at home and obtain a primary care doctor to go over the results.

## 2020-11-26 NOTE — ED Notes (Signed)
Checked CBG [id number], RN Megan informed

## 2022-04-29 IMAGING — CR DG CHEST 2V
2 series · 2 of 2 positions shown · non-contrast
Comparison: September 15, 2018

CLINICAL DATA: Chest pain.

EXAM:
CHEST - 2 VIEW

[w chest pa]
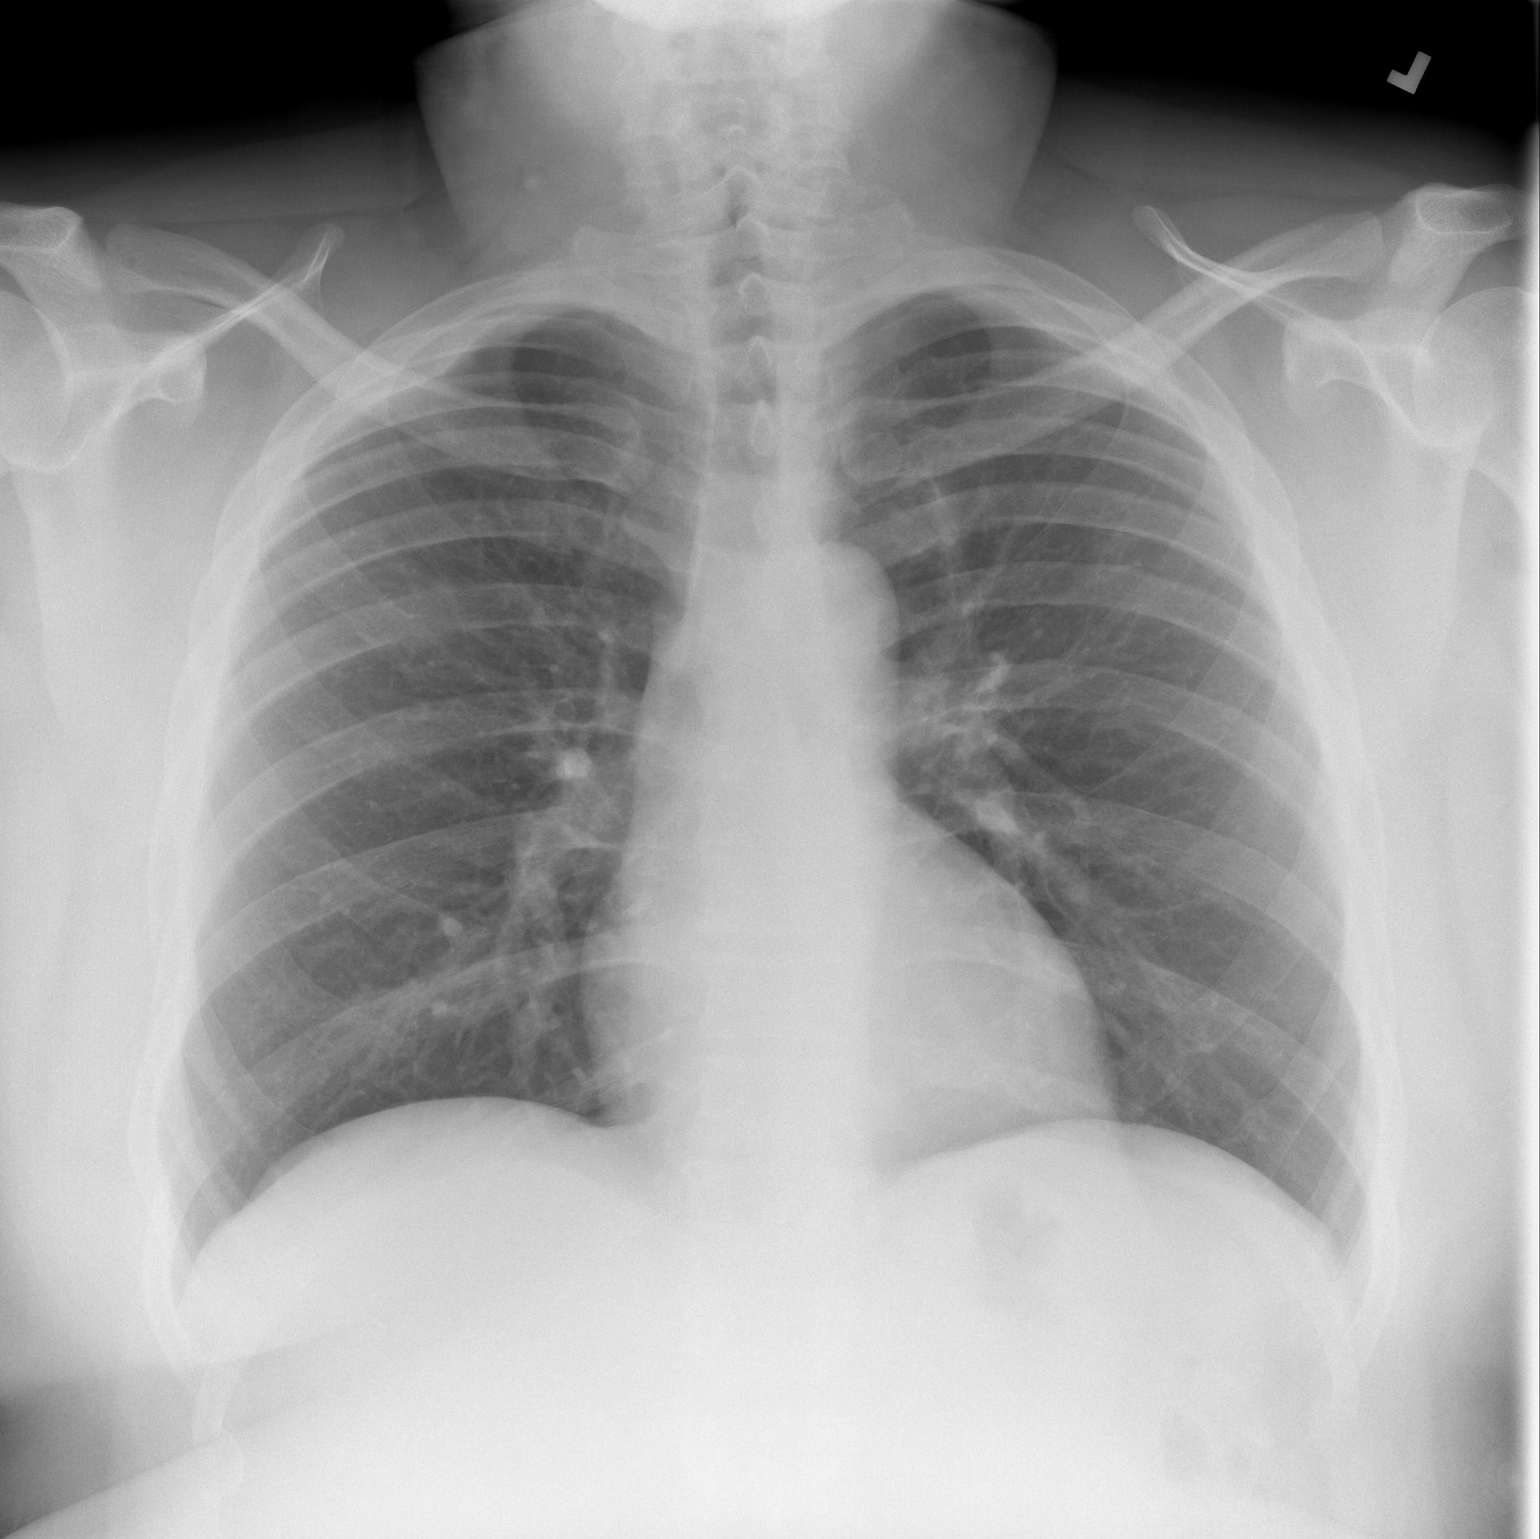

[w chest lat]
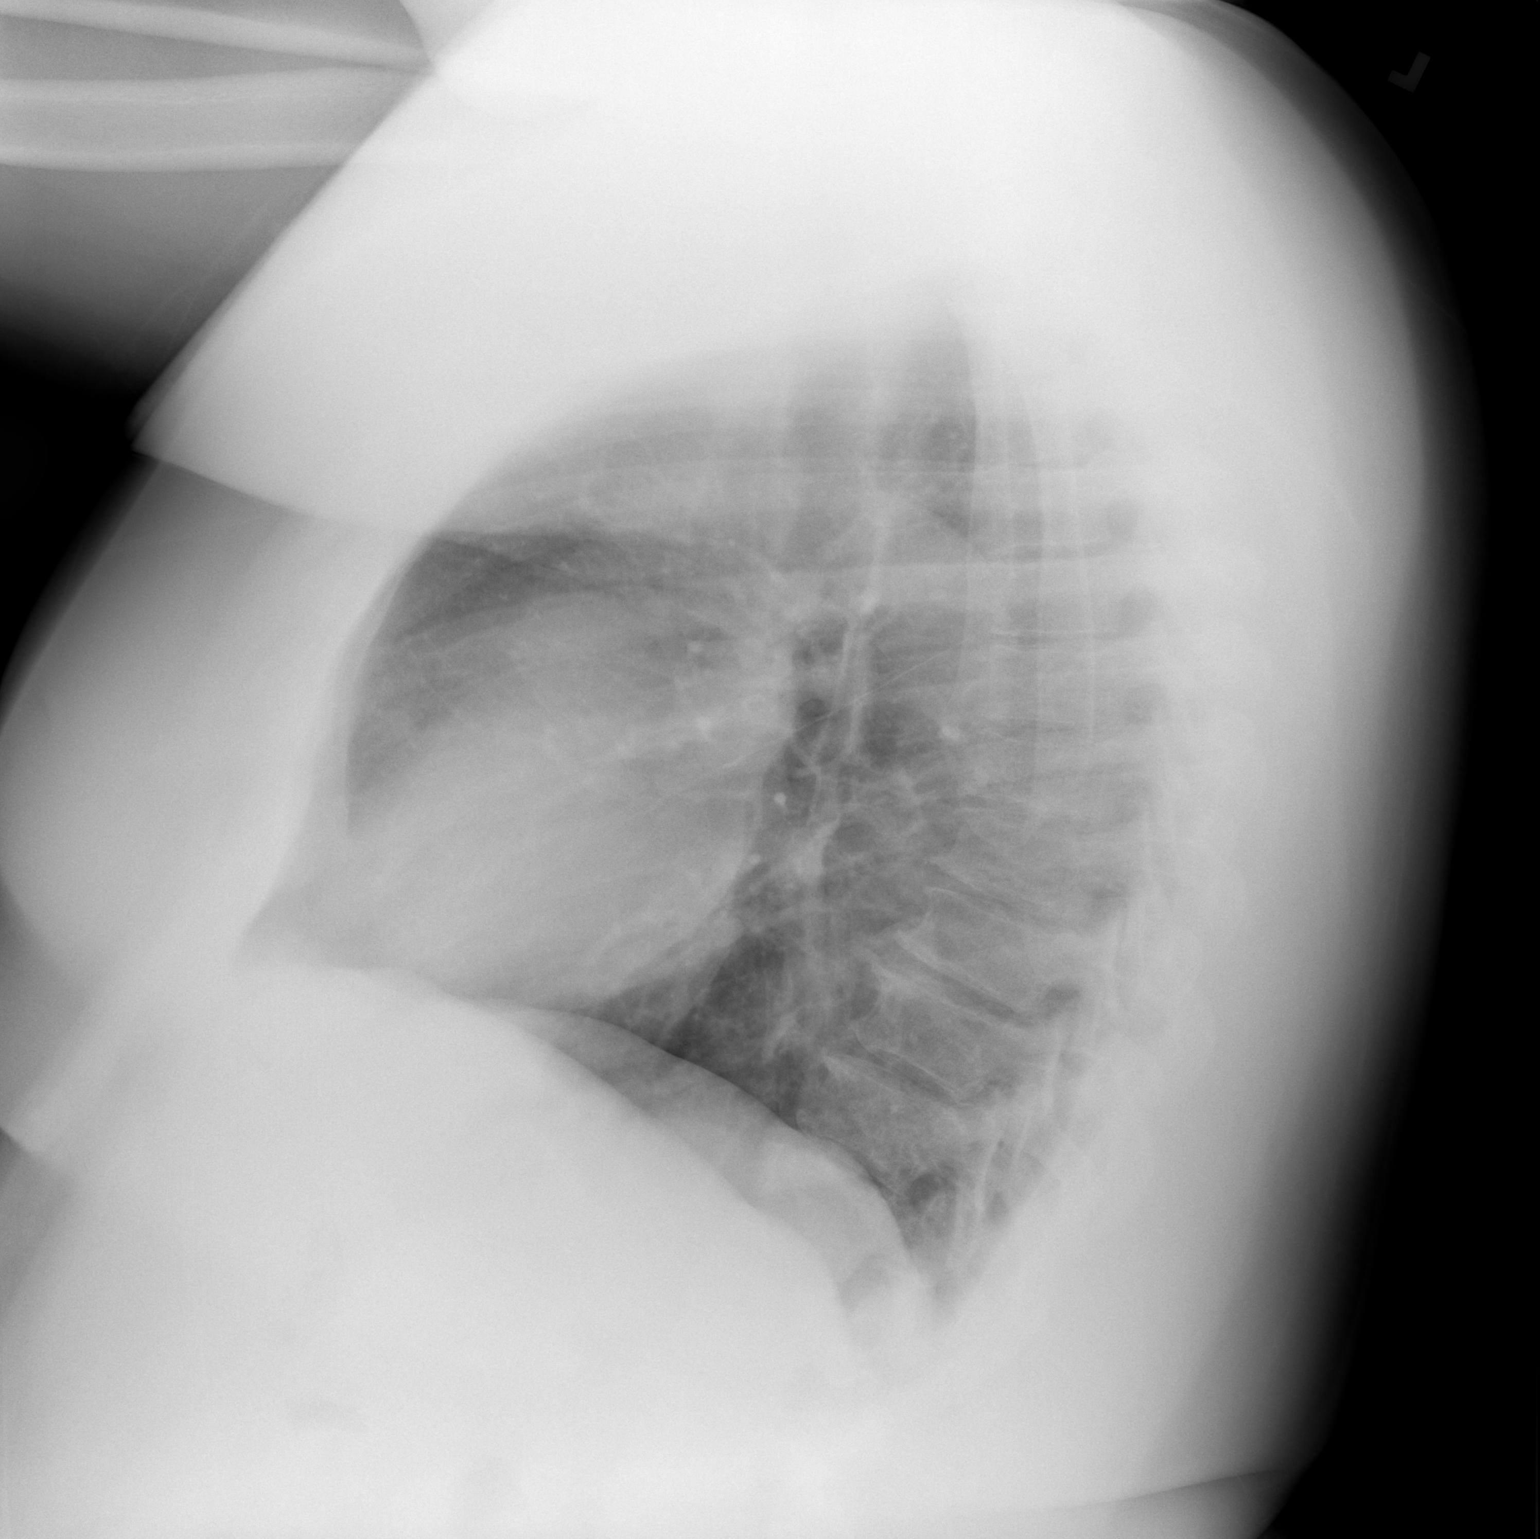

[2 of 2 positions shown; findings below may reference images not displayed]

FINDINGS: The heart size and mediastinal contours are within normal limits.
Both lungs are clear. The visualized skeletal structures are
unremarkable.
IMPRESSION: No active cardiopulmonary disease.
